# Patient Record
Sex: Male | Born: 2005 | Hispanic: Yes | Marital: Single | Smoking: Never smoker
Health system: Southern US, Community
[De-identification: ages and names within clinical notes are randomized; demographics above are authoritative.]

## PROBLEM LIST (undated history)

## (undated) ENCOUNTER — Ambulatory Visit: Admission: EM | Payer: Self-pay | Source: Home / Self Care

---
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
